# Patient Record
Sex: Male | Born: 1995 | Race: Black or African American | Hispanic: No | Marital: Single | Smoking: Former smoker
Health system: Southern US, Community
[De-identification: ages and names within clinical notes are randomized; demographics above are authoritative.]

---
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
## 2014-03-22 ENCOUNTER — Encounter (HOSPITAL_COMMUNITY): Payer: Self-pay | Admitting: Emergency Medicine

## 2014-03-22 ENCOUNTER — Emergency Department (HOSPITAL_COMMUNITY): Payer: Medicaid Other

## 2014-03-22 ENCOUNTER — Emergency Department (HOSPITAL_COMMUNITY)
Admission: EM | Admit: 2014-03-22 | Discharge: 2014-03-22 | Disposition: A | Discharge status: Home / Self Care | Payer: Medicaid Other | Attending: Emergency Medicine | Admitting: Emergency Medicine

## 2014-03-22 DIAGNOSIS — Y92410 Unspecified street and highway as the place of occurrence of the external cause: Secondary | ICD-10-CM | POA: Insufficient documentation

## 2014-03-22 DIAGNOSIS — IMO0002 Reserved for concepts with insufficient information to code with codable children: Secondary | ICD-10-CM | POA: Insufficient documentation

## 2014-03-22 DIAGNOSIS — S0990XA Unspecified injury of head, initial encounter: Secondary | ICD-10-CM | POA: Diagnosis present

## 2014-03-22 DIAGNOSIS — Z791 Long term (current) use of non-steroidal anti-inflammatories (NSAID): Secondary | ICD-10-CM | POA: Diagnosis not present

## 2014-03-22 DIAGNOSIS — S0001XA Abrasion of scalp, initial encounter: Secondary | ICD-10-CM

## 2014-03-22 DIAGNOSIS — Y9389 Activity, other specified: Secondary | ICD-10-CM | POA: Insufficient documentation
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
## 2014-05-02 ENCOUNTER — Emergency Department (HOSPITAL_BASED_OUTPATIENT_CLINIC_OR_DEPARTMENT_OTHER): Payer: Medicaid Other

## 2014-05-02 ENCOUNTER — Encounter (HOSPITAL_BASED_OUTPATIENT_CLINIC_OR_DEPARTMENT_OTHER): Payer: Self-pay | Admitting: Emergency Medicine

## 2014-05-02 ENCOUNTER — Emergency Department (HOSPITAL_BASED_OUTPATIENT_CLINIC_OR_DEPARTMENT_OTHER)
Admission: EM | Admit: 2014-05-02 | Discharge: 2014-05-02 | Disposition: A | Discharge status: Home / Self Care | Payer: Medicaid Other | Attending: Emergency Medicine | Admitting: Emergency Medicine

## 2014-05-02 DIAGNOSIS — Z79899 Other long term (current) drug therapy: Secondary | ICD-10-CM | POA: Diagnosis not present

## 2014-05-02 DIAGNOSIS — Z720 Tobacco use: Secondary | ICD-10-CM | POA: Insufficient documentation

## 2014-05-02 DIAGNOSIS — R05 Cough: Secondary | ICD-10-CM | POA: Diagnosis not present

## 2014-05-02 DIAGNOSIS — R059 Cough, unspecified: Secondary | ICD-10-CM

## 2014-05-02 MED ORDER — GUAIFENESIN 100 MG/5ML PO SYRP: 100.0000 mg | ORAL_SOLUTION | ORAL | Status: AC | PRN

## 2014-05-02 MED ORDER — ALBUTEROL SULFATE HFA 108 (90 BASE) MCG/ACT IN AERS: 1.0000 | INHALATION_SPRAY | Freq: Four times a day (QID) | RESPIRATORY_TRACT | Status: AC | PRN

## 2014-05-02 NOTE — Discharge Instructions (Signed)
Cough, Adult  A cough is a reflex that helps clear your throat and airways. It can help heal the body or may be a reaction to an irritated airway. A cough may only last 2 or 3 weeks (acute) or may last more than 8 weeks (chronic).  CAUSES Acute cough:  Viral or bacterial infections. Chronic cough:  Infections.  Allergies.  Asthma.  Post-nasal drip.  Smoking.  Heartburn or acid reflux.  Some medicines.  Chronic lung problems (COPD).  Cancer. SYMPTOMS   Cough.  Fever.  Chest pain.  Increased breathing rate.  High-pitched whistling sound when breathing (wheezing).  Colored mucus that you cough up (sputum). TREATMENT   A bacterial cough may be treated with antibiotic medicine.  A viral cough must run its course and will not respond to antibiotics.  Your caregiver may recommend other treatments if you have a chronic cough. HOME CARE INSTRUCTIONS   Only take over-the-counter or prescription medicines for pain, discomfort, or fever as directed by your caregiver. Use cough suppressants only as directed by your caregiver.  Use a cold steam vaporizer or humidifier in your bedroom or home to help loosen secretions.  Sleep in a semi-upright position if your cough is worse at night.  Rest as needed.  Stop smoking if you smoke. SEEK IMMEDIATE MEDICAL CARE IF:   You have pus in your sputum.  Your cough starts to worsen.  You cannot control your cough with suppressants and are losing sleep.  You begin coughing up blood.  You have difficulty breathing.  You develop pain which is getting worse or is uncontrolled with medicine.  You have a fever. MAKE SURE YOU:   Understand these instructions.  Will watch your condition.  Will get help right away if you are not doing well or get worse. Document Released: 12/30/2010 Document Revised: 09/25/2011 Document Reviewed: 12/30/2010 Exit[employer] Patient Information 2015 [employer]. This information is not intended  to replace advice given to you by your health care provider. Make sure you discuss any questions you have with your health care provider.  

## 2014-05-02 NOTE — ED Notes (Signed)
Pt reports cough with sputum production x1 week. Reports cold s/s one week ago.

## 2014-05-02 NOTE — ED Provider Notes (Signed)
CSN: [id number]     Arrival date & time 05/02/14  1819 History  This chart was scribed for Kurt Guzman Obed Samek, MD by Roxy Cedarhandni Bhalodia, ED Scribe. This patient was seen in room MH01/[facility]01 and the patient's care was started at 6:27 PM.   Chief Complaint  Patient presents with  . Cough   Patient is a 18 y.o. male presenting with cough. The history is provided by the patient. No language interpreter was used.  Cough  HPI Comments: Kurt Guzman is a 18 y.o. male with no chronic medical conditions, who presents to the Emergency Department complaining of a moderate productive cough that began 1 week ago. Patient states that he had rhinorrhea and sore throat 1 week ago. Patient has had his cough since cold symptoms began 1 week ago. Patient states that he experiences chest pain with onset of cough. He states that he coughs more during the night than during the day, and reports associated wheezing during the night. Patient states that he used to smoke 1 pack every 2 days but has stopped smoking for the past month.  Patient denies associated fever, headache or hemoptysis.   History reviewed. No pertinent past medical history. History reviewed. No pertinent past surgical history. History reviewed. No pertinent family history. History  Substance Use Topics  . Smoking status: Current Every Day Smoker  . Smokeless tobacco: Not on file  . Alcohol Use: Yes   Review of Systems  Respiratory: Positive for cough.    A complete 10 system review of systems was obtained and all systems are negative except as noted in the HPI and PMH.   Allergies  Review of patient's allergies indicates no known allergies.  Home Medications   Prior to Admission medications   Medication Sig Start Date End Date Taking? Authorizing Provider  albuterol (PROVENTIL HFA;VENTOLIN HFA) 108 (90 BASE) MCG/ACT inhaler Inhale 1-2 puffs into the lungs every 6 (six) hours as needed for wheezing or shortness of breath. 05/02/14   Kurt Guzman  Naavya Postma, MD  guaifenesin (ROBITUSSIN) 100 MG/5ML syrup Take 5-10 mLs (100-200 mg total) by mouth every 4 (four) hours as needed for cough. 05/02/14   Kurt Guzman [name], MD   Triage Vitals: BP 145/105  Pulse 63  Temp(Src) 98.1 F (36.7 C) (Oral)  Resp 21  Ht 6\' 1"  (1.854 m)  Wt 135 lb (61.236 kg)  BMI 17.82 kg/m2  SpO2 100%  Physical Exam  Nursing note and vitals reviewed. Constitutional: He is oriented to person, place, and time. He appears well-developed and well-nourished. No distress.  HENT:  Head: Normocephalic and atraumatic.  Mouth/Throat: Oropharynx is clear and moist. No oropharyngeal exudate.  Eyes: Conjunctivae and EOM are normal. Pupils are equal, round, and reactive to light.  Neck: Normal range of motion. Neck supple.  No meningismus.  Cardiovascular: Normal rate, regular rhythm, normal heart sounds and intact distal pulses.   No murmur heard. Pulmonary/Chest: Effort normal and breath sounds normal. No respiratory distress.  Lungs are clear. No wheezing. No distress.  Abdominal: Soft. There is no tenderness. There is no rebound and no guarding.  Musculoskeletal: Normal range of motion. He exhibits no edema and no tenderness.  Neurological: He is alert and oriented to person, place, and time. No cranial nerve deficit. He exhibits normal muscle tone. Coordination normal.  No ataxia on finger to nose bilaterally. No pronator drift. 5/5 strength throughout. CN 2-12 intact. Negative Romberg. Equal grip strength. Sensation intact. Gait is normal.   Skin: Skin is warm.  Psychiatric: He  has a normal mood and affect. His behavior is normal.   ED Course  Procedures (including critical care time)  DIAGNOSTIC STUDIES: Oxygen Saturation is 100% on RA, normal by my interpretation.    COORDINATION OF CARE: 6:33 PM- Discussed plans to order diagnostic CXR. Pt advised of plan for treatment and pt agrees.  7:37 PM- Discussed normal CXR results. Advised patient to obtain OTC cough  medicine.  Labs Review Labs Reviewed - No data to display  Imaging Review Dg Chest 2 View  05/02/2014   CLINICAL DATA:  18 year old cough for the past week. Prior smoker. Initial encounter.  EXAM: CHEST  2 VIEW  COMPARISON:  None.  FINDINGS: No infiltrate, congestive heart failure or pneumothorax.  Heart size within normal limits.  No plain film evidence of pulmonary malignancy or adenopathy.  No osseous abnormality noted.  IMPRESSION: No acute abnormality.  Please see above.   Electronically Signed   By: Bridgett LarssonSteve  Olson M.D.   On: 05/02/2014 19:21     EKG Interpretation None     MDM   Final diagnoses:  Cough  1 week of cough with clear sputum.  URI symptoms last week.  Chest pain with coughing.  No SOB.  Chest x-ray negative. Patient no distress. No hypoxia no tachycardia.  Supportive care for cough with cough syrup, smoking cessation. No indication for antibiotics.  BP 144/88  Pulse 60  Temp(Src) 98.3 F (36.8 C) (Oral)  Resp 16  Ht 6\' 1"  (1.854 m)  Wt 135 lb (61.236 kg)  BMI 17.82 kg/m2  SpO2 100%   I personally performed the services described in this documentation, which was scribed in my presence. The recorded information has been reviewed and is accurate.  Kurt Guzman Kurt Grudzien, MD 05/02/14 2233

## 2016-08-11 ENCOUNTER — Encounter (HOSPITAL_BASED_OUTPATIENT_CLINIC_OR_DEPARTMENT_OTHER): Payer: Self-pay | Admitting: *Deleted

## 2016-08-11 ENCOUNTER — Emergency Department (HOSPITAL_BASED_OUTPATIENT_CLINIC_OR_DEPARTMENT_OTHER)
Admission: EM | Admit: 2016-08-11 | Discharge: 2016-08-11 | Disposition: A | Discharge status: Home / Self Care | Payer: Medicaid Other | Attending: Emergency Medicine | Admitting: Emergency Medicine

## 2016-08-11 DIAGNOSIS — R0981 Nasal congestion: Secondary | ICD-10-CM | POA: Insufficient documentation

## 2016-08-11 DIAGNOSIS — Z79899 Other long term (current) drug therapy: Secondary | ICD-10-CM | POA: Insufficient documentation

## 2016-08-11 DIAGNOSIS — H9202 Otalgia, left ear: Secondary | ICD-10-CM | POA: Insufficient documentation

## 2016-08-11 DIAGNOSIS — F17200 Nicotine dependence, unspecified, uncomplicated: Secondary | ICD-10-CM | POA: Insufficient documentation

## 2016-08-11 DIAGNOSIS — F1290 Cannabis use, unspecified, uncomplicated: Secondary | ICD-10-CM | POA: Insufficient documentation

## 2016-08-11 DIAGNOSIS — J3489 Other specified disorders of nose and nasal sinuses: Secondary | ICD-10-CM | POA: Insufficient documentation

## 2016-08-11 MED ORDER — FLUTICASONE PROPIONATE 50 MCG/ACT NA SUSP: 2.0000 | Freq: Every day | NASAL | 0 refills | Status: AC

## 2016-08-11 MED ORDER — OXYMETAZOLINE HCL 0.05 % NA SOLN: 2.0000 | Freq: Two times a day (BID) | NASAL | 0 refills | Status: AC

## 2016-08-11 NOTE — Discharge Instructions (Signed)
1. Medications: Afrin, Flonase, Mucinex, usual home medications 2. Treatment: rest, drink plenty of fluids,  3. Follow Up: Please followup with your primary doctor in 7 days for discussion of your diagnoses and further evaluation after today's visit; if you do not have a primary care doctor use the resource guide provided to find one; Please return to the ER for fevers, worsening pain, decreased hearing or other concerns

## 2016-08-11 NOTE — ED Provider Notes (Signed)
[facility] Provider Note   CSN: [id number] Arrival date & time: 08/11/16  1446     History   Chief Complaint Chief Complaint  Patient presents with  . Otalgia    HPI Kurt Guzman is a 21 y.o. male with no major medical hx presents to the Emergency Department complaining of gradual, persistent, progressively worsening Left otalgia onset yesterday after approximately 1 week of URI symptoms including rhinorrhea, nasal congestion, sore throat and cough. He reports that all of the symptoms have resolved and he is feeling much better but developed left otalgia yesterday. Patient reports using antibiotic drops from his mother without relief 1. No other treatments prior to arrival. Nothing makes the symptoms better or worse. He denies ringing or buzzing in his ear. He denies fevers or chills, nausea or vomiting, vision changes, purulent drainage from his nose or epistaxis.   The history is provided by the patient and medical records. No language interpreter was used.    History reviewed. No pertinent past medical history.  There are no active problems to display for this patient.   History reviewed. No pertinent surgical history.     Home Medications    Prior to Admission medications   Medication Sig Start Date End Date Taking? Authorizing Provider  albuterol (PROVENTIL HFA;VENTOLIN HFA) 108 (90 BASE) MCG/ACT inhaler Inhale 1-2 puffs into the lungs every 6 (six) hours as needed for wheezing or shortness of breath. 05/02/14   Glynn Octave, MD  fluticasone (FLONASE) 50 MCG/ACT nasal spray Place 2 sprays into both nostrils daily. 08/11/16   Deshante Cassell, PA-C  guaifenesin (ROBITUSSIN) 100 MG/5ML syrup Take 5-10 mLs (100-200 mg total) by mouth every 4 (four) hours as needed for cough. 05/02/14   Glynn Octave, MD  oxymetazoline (AFRIN NASAL SPRAY) 0.05 % nasal spray Place 2 sprays into both nostrils 2 (two) times daily. 08/11/16 08/14/16  Dahlia Client Zakir Henner, PA-C      Family History No family history on file.  Social History Social History  Substance Use Topics  . Smoking status: Current Every Day Smoker  . Smokeless tobacco: Never Used  . Alcohol use Yes     Allergies   Patient has no known allergies.   Review of Systems Review of Systems  Constitutional: Negative for chills and fever.  HENT: Positive for congestion and ear pain. Negative for sore throat.   Eyes: Negative for visual disturbance.  Respiratory: Negative for cough.   Gastrointestinal: Negative for nausea and vomiting.  Skin: Negative for rash.  Neurological: Negative for headaches.  All other systems reviewed and are negative.    Physical Exam Updated Vital Signs BP 140/90 (BP Location: Right Arm)   Pulse (!) 58   Temp 98.7 F (37.1 C) (Oral)   Resp 16   Ht 6\' 1"  (1.854 m)   Wt 61.2 kg   SpO2 100%   BMI 17.81 kg/m   Physical Exam  Constitutional: He appears well-developed and well-nourished. No distress.  HENT:  Head: Normocephalic and atraumatic.  Right Ear: Tympanic membrane, external ear and ear canal normal.  Left Ear: External ear and ear canal normal. A middle ear effusion ( Clear fluid) is present.  Nose: Mucosal edema and rhinorrhea present. No epistaxis. Right sinus exhibits no maxillary sinus tenderness and no frontal sinus tenderness. Left sinus exhibits no maxillary sinus tenderness and no frontal sinus tenderness.  Mouth/Throat: Uvula is midline and mucous membranes are normal. Mucous membranes are not pale and not cyanotic. No oropharyngeal exudate, posterior oropharyngeal  edema, posterior oropharyngeal erythema or tonsillar abscesses.  Eyes: Conjunctivae are normal. Pupils are equal, round, and reactive to light.  Neck: Normal range of motion and full passive range of motion without pain.  Cardiovascular: Normal rate and intact distal pulses.   Pulmonary/Chest: Effort normal and breath sounds normal. No stridor.  Clear and equal breath  sounds without focal wheezes, rhonchi, rales  Abdominal: Soft. There is no tenderness.  Musculoskeletal: Normal range of motion.  Lymphadenopathy:    He has no cervical adenopathy.  Neurological: He is alert.  Skin: Skin is warm and dry. No rash noted. He is not diaphoretic.  Psychiatric: He has a normal mood and affect.  Nursing note and vitals reviewed.    ED Treatments / Results   Procedures Procedures (including critical care time)  Medications Ordered in ED Medications - No data to display   Initial Impression / Assessment and Plan / ED Course  I have reviewed the triage vital signs and the nursing notes.  Pertinent labs & imaging results that were available during my care of the patient were reviewed by me and considered in my medical decision making (see chart for details).     Patient with recent viral URI. Left ear with clear effusion. No evidence of otitis media. Will give nasal spray. Recommend Mucinex and antihistamine over-the-counter. Patient states understanding and is in agreement with the plan.  Final Clinical Impressions(s) / ED Diagnoses   Final diagnoses:  Otalgia of left ear    New Prescriptions New Prescriptions   FLUTICASONE (FLONASE) 50 MCG/ACT NASAL SPRAY    Place 2 sprays into both nostrils daily.   OXYMETAZOLINE (AFRIN NASAL SPRAY) 0.05 % NASAL SPRAY    Place 2 sprays into both nostrils 2 (two) times daily.     Dahlia ClientHannah Geniyah Eischeid, PA-C 08/11/16 1558    Jacalyn LefevreJulie Haviland, MD 08/14/16 1459

## 2016-08-11 NOTE — ED Triage Notes (Signed)
Left ear pain for a week

## 2023-03-18 ENCOUNTER — Ambulatory Visit
Admission: EM | Admit: 2023-03-18 | Discharge: 2023-03-18 | Disposition: A | Discharge status: Home / Self Care | Payer: Self-pay | Attending: Internal Medicine | Admitting: Internal Medicine

## 2023-03-18 DIAGNOSIS — J111 Influenza due to unidentified influenza virus with other respiratory manifestations: Secondary | ICD-10-CM

## 2023-03-18 DIAGNOSIS — R040 Epistaxis: Secondary | ICD-10-CM

## 2023-03-18 MED ORDER — PSEUDOEPHEDRINE HCL 60 MG PO TABS
60.0000 mg | ORAL_TABLET | Freq: Three times a day (TID) | ORAL | 0 refills | Status: AC | PRN
Start: 2023-03-18 — End: ?

## 2023-03-18 MED ORDER — PROMETHAZINE-DM 6.25-15 MG/5ML PO SYRP: 5.0000 mL | ORAL_SOLUTION | Freq: Three times a day (TID) | ORAL | 0 refills | Status: AC | PRN

## 2023-03-18 MED ORDER — CETIRIZINE HCL 10 MG PO TABS: 10.0000 mg | ORAL_TABLET | Freq: Every day | ORAL | 0 refills | Status: AC

## 2023-03-18 MED ORDER — OXYMETAZOLINE HCL 0.05 % NA SOLN
1.0000 | Freq: Two times a day (BID) | NASAL | Status: DC
  Administered 2023-03-18: 1 via NASAL

## 2023-03-18 NOTE — ED Notes (Signed)
Pt is not active bleeding at this moment, gauze given to the pt, seating lean forward and pinch his nose.

## 2023-03-18 NOTE — ED Triage Notes (Signed)
Pt reports he had nosebleed last night when he blew his nose, started bleeding again today 20 min ago.

## 2023-03-18 NOTE — ED Provider Notes (Signed)
[facility]  Note:  This document was prepared using Conservation officer, historic buildings and may include unintentional dictation errors.  MRN: [id number] DOB: 1996-05-29  Subjective:   Kurt Guzman is a 27 y.o. male presenting for 1 day history of persistent intermittent bleeding from his nose.  Symptoms started after he blew his nose really hard yesterday.  Was more controlled this morning until an hour prior to arrival in the clinic.  The symptoms have waxed and waned but for the past 20 minutes has persisted.  No current facility-administered medications for this encounter.  Current Outpatient Medications:    albuterol (PROVENTIL HFA;VENTOLIN HFA) 108 (90 BASE) MCG/ACT inhaler, Inhale 1-2 puffs into the lungs every 6 (six) hours as needed for wheezing or shortness of breath., Disp: 1 Inhaler, Rfl: 0   fluticasone (FLONASE) 50 MCG/ACT nasal spray, Place 2 sprays into both nostrils daily., Disp: 9.9 g, Rfl: 0   guaifenesin (ROBITUSSIN) 100 MG/5ML syrup, Take 5-10 mLs (100-200 mg total) by mouth every 4 (four) hours as needed for cough., Disp: 60 mL, Rfl: 0   No Known Allergies  History reviewed. No pertinent past medical history.   History reviewed. No pertinent surgical history.  History reviewed. No pertinent family history.  Social History   Tobacco Use   Smoking status: Former    Types: Cigarettes   Smokeless tobacco: Never  Vaping Use   Vaping status: Never Used  Substance Use Topics   Alcohol use: Yes    Comment: Socially   Drug use: Yes    Frequency: 3.0 times per week    Types: Marijuana    ROS   Objective:   Vitals: BP (!) 139/95 (BP Location: Left Arm)   Pulse 95   Temp 98.4 F (36.9 C) (Temporal)   Resp 18   SpO2 96%   Physical Exam Constitutional:      General: He is not in acute distress.    Appearance: Normal appearance. He is well-developed and normal weight. He is not ill-appearing, toxic-appearing or diaphoretic.   HENT:     Head: Normocephalic and atraumatic.     Right Ear: External ear normal.     Left Ear: External ear normal.     Nose: Congestion present. No rhinorrhea.     Right Nostril: Epistaxis present. No foreign body, septal hematoma or occlusion.     Left Nostril: Epistaxis present. No foreign body, septal hematoma or occlusion.     Mouth/Throat:     Pharynx: Oropharynx is clear.  Eyes:     General: No scleral icterus.       Right eye: No discharge.        Left eye: No discharge.     Extraocular Movements: Extraocular movements intact.  Cardiovascular:     Rate and Rhythm: Normal rate.  Pulmonary:     Effort: Pulmonary effort is normal.  Musculoskeletal:     Cervical back: Normal range of motion.  Neurological:     Mental Status: He is alert and oriented to person, place, and time.  Psychiatric:        Mood and Affect: Mood normal.        Behavior: Behavior normal.        Thought Content: Thought content normal.        Judgment: Judgment normal.      Assessment and Plan :   PDMP not reviewed this encounter.  1. Epistaxis   2. Influenza    Unfortunately, our clinic  does not carry any Rhino Rockets, do not have the ability to cauterize here.  Attempted to use gauze packing, nasal pressure and oxymetazoline for his epistaxis.  Recommended controlling his sinus symptoms with Zyrtec, pseudoephedrine at home.  His wife expressed significant concern with these measures and therefore recommended he present to the emergency room for further intervention.    Wallis Bamberg, PA-C 03/18/23 1259

## 2023-03-18 NOTE — Discharge Instructions (Addendum)
Use the oxymetazoline again today and twice more tomorrow.  If you continue to have nasal bleeding that you cannot control with the measures we have tried then please go to the emergency room to see if they can apply a Rhino Rocket.  I tried to pack your nose with gauze and lubricant and recommend that you attempt these measures together with firm pressure directly over the nose to help with the nasal bleeding.  Please go to the emergency room as they would have the ability to pack your nose using a Rhino Rocket.  Unfortunately our clinic does not carry this or cautery.  For now avoid blowing your nose excessively.  I am prescribing you Zyrtec and pseudoephedrine to help you with your nasal congestion, sneezing and drainage.  Use the cough syrup to help with avoiding this as a source of your symptoms/nasal bleeding.

## 2024-01-27 ENCOUNTER — Ambulatory Visit
Admission: EM | Admit: 2024-01-27 | Discharge: 2024-01-27 | Disposition: A | Discharge status: Home / Self Care | Payer: Self-pay | Attending: Physician Assistant | Admitting: Physician Assistant

## 2024-01-27 ENCOUNTER — Other Ambulatory Visit: Payer: Self-pay

## 2024-01-27 DIAGNOSIS — J029 Acute pharyngitis, unspecified: Secondary | ICD-10-CM | POA: Insufficient documentation

## 2024-01-27 DIAGNOSIS — J069 Acute upper respiratory infection, unspecified: Secondary | ICD-10-CM | POA: Insufficient documentation

## 2024-01-27 LAB — POC COVID19/FLU A&B COMBO
Covid Antigen, POC: NEGATIVE
Influenza A Antigen, POC: NEGATIVE
Influenza B Antigen, POC: NEGATIVE

## 2024-01-27 LAB — POCT RAPID STREP A (OFFICE): Rapid Strep A Screen: NEGATIVE

## 2024-01-27 NOTE — Discharge Instructions (Signed)

## 2024-01-27 NOTE — ED Triage Notes (Signed)
 Pt presents with complaints of sore throat and headache x 6 days. States his symptoms began after returning from vacation at the beach. Currently rates overall pain an 8/10. Denies fevers at home. OTC Advil  + Aleve taken, also leftover Amoxicillin tablet.

## 2024-01-27 NOTE — ED Provider Notes (Signed)
 [facility]    CSN: [id number] Arrival date & time: 01/27/24  1353      History   Chief Complaint Chief Complaint  Patient presents with   Sore Throat   Headache    HPI Kurt Guzman is a 28 y.o. male.   HPI   Pt presents today for concerns of headache, sore throat, new ear pain He also reports severe dry cough last night that is not productive  He reports he has had symptoms for about 3-4 days and recently returned from the beach He has been taking Mucinex ,Naproxen, Amoxicillin    History reviewed. No pertinent past medical history.  There are no active problems to display for this patient.   History reviewed. No pertinent surgical history.     Home Medications    Prior to Admission medications   Medication Sig Start Date End Date Taking? Authorizing Provider  albuterol  (PROVENTIL  HFA;VENTOLIN  HFA) 108 (90 BASE) MCG/ACT inhaler Inhale 1-2 puffs into the lungs every 6 (six) hours as needed for wheezing or shortness of breath. 05/02/14   Rancour, Garnette, MD  cetirizine  (ZYRTEC  ALLERGY) 10 MG tablet Take 1 tablet (10 mg total) by mouth daily. 03/18/23   Christopher Savannah, PA-C  fluticasone  (FLONASE ) 50 MCG/ACT nasal spray Place 2 sprays into both nostrils daily. 08/11/16   Muthersbaugh, Chiquita, PA-C  guaifenesin  (ROBITUSSIN) 100 MG/5ML syrup Take 5-10 mLs (100-200 mg total) by mouth every 4 (four) hours as needed for cough. 05/02/14   Rancour, Garnette, MD  promethazine -dextromethorphan  (PROMETHAZINE -DM) 6.25-15 MG/5ML syrup Take 5 mLs by mouth 3 (three) times daily as needed for cough. 03/18/23   Christopher Savannah, PA-C  pseudoephedrine  (SUDAFED) 60 MG tablet Take 1 tablet (60 mg total) by mouth every 8 (eight) hours as needed for congestion. 03/18/23   Christopher Savannah, PA-C    Family History History reviewed. No pertinent family history.  Social History Social History   Tobacco Use   Smoking status: Former    Types: Cigarettes   Smokeless tobacco: Never  Vaping Use    Vaping status: Never Used  Substance Use Topics   Alcohol use: Yes    Comment: Socially   Drug use: Yes    Frequency: 3.0 times per week    Types: Marijuana     Allergies   Patient has no known allergies.   Review of Systems Review of Systems  Constitutional:  Negative for chills and fever.  HENT:  Positive for congestion, ear pain and sore throat. Negative for postnasal drip and rhinorrhea.   Respiratory:  Negative for cough, shortness of breath and wheezing.   Gastrointestinal:  Negative for diarrhea, nausea and vomiting.  Musculoskeletal:  Negative for myalgias.  Skin:  Negative for rash.  Neurological:  Positive for headaches. Negative for dizziness and light-headedness.     Physical Exam Triage Vital Signs ED Triage Vitals  Encounter Vitals Group     BP 01/27/24 1417 (!) 144/93     Girls Systolic BP Percentile --      Girls Diastolic BP Percentile --      Boys Systolic BP Percentile --      Boys Diastolic BP Percentile --      Pulse Rate 01/27/24 1417 62     Resp 01/27/24 1417 18     Temp 01/27/24 1417 98.2 F (36.8 C)     Temp Source 01/27/24 1417 Oral     SpO2 01/27/24 1417 98 %     Weight 01/27/24 1417 185 lb (83.9 kg)  Height 01/27/24 1417 6' 1 (1.854 m)     Head Circumference --      Peak Flow --      Pain Score 01/27/24 1430 8     Pain Loc --      Pain Education --      Exclude from Growth Chart --    No data found.  Updated Vital Signs BP (!) 144/93 (BP Location: Right Arm)   Pulse 62   Temp 98.2 F (36.8 C) (Oral)   Resp 18   Ht 6' 1 (1.854 m)   Wt 185 lb (83.9 kg)   SpO2 98%   BMI 24.41 kg/m   Visual Acuity Right Eye Distance:   Left Eye Distance:   Bilateral Distance:    Right Eye Near:   Left Eye Near:    Bilateral Near:     Physical Exam Vitals reviewed.  Constitutional:      General: He is awake.     Appearance: Normal appearance. He is well-developed and well-groomed.  HENT:     Head: Normocephalic and  atraumatic.     Right Ear: Hearing, tympanic membrane and ear canal normal.     Left Ear: Hearing, tympanic membrane and ear canal normal.     Mouth/Throat:     Lips: Pink.     Mouth: Mucous membranes are moist.     Pharynx: Uvula midline. Pharyngeal swelling and posterior oropharyngeal erythema present. No oropharyngeal exudate, uvula swelling or postnasal drip.     Tonsils: No tonsillar exudate or tonsillar abscesses. 1+ on the right. 2+ on the left.  Eyes:     General: Lids are normal. Gaze aligned appropriately.     Extraocular Movements: Extraocular movements intact.  Cardiovascular:     Rate and Rhythm: Normal rate and regular rhythm.     Heart sounds: Normal heart sounds.  Pulmonary:     Effort: Pulmonary effort is normal.     Breath sounds: Normal breath sounds. No decreased air movement. No decreased breath sounds, wheezing, rhonchi or rales.  Musculoskeletal:     Cervical back: Normal range of motion and neck supple.  Lymphadenopathy:     Head:     Right side of head: No submental, submandibular or preauricular adenopathy.     Left side of head: No submental, submandibular or preauricular adenopathy.     Cervical:     Right cervical: No superficial cervical adenopathy.    Left cervical: No superficial cervical adenopathy.     Upper Body:     Right upper body: No supraclavicular adenopathy.     Left upper body: No supraclavicular adenopathy.  Skin:    General: Skin is warm and dry.  Neurological:     General: No focal deficit present.     Mental Status: He is alert and oriented to person, place, and time.  Psychiatric:        Mood and Affect: Mood normal.        Behavior: Behavior normal. Behavior is cooperative.        Thought Content: Thought content normal.        Judgment: Judgment normal.      UC Treatments / Results  Labs (all labs ordered are listed, but only abnormal results are displayed) Labs Reviewed  CULTURE, GROUP A STREP ([facility])  POC COVID19/FLU  A&B COMBO  POCT RAPID STREP A (OFFICE)    EKG   Radiology No results found.  Procedures Procedures (including critical care time)  Medications Ordered  in UC Medications - No data to display  Initial Impression / Assessment and Plan / UC Course  I have reviewed the triage vital signs and the nursing notes.  Pertinent labs & imaging results that were available during my care of the patient were reviewed by me and considered in my medical decision making (see chart for details).      Final Clinical Impressions(s) / UC Diagnoses   Final diagnoses:  Sore throat  Acute upper respiratory infection   Visit with patient indicates symptoms comprised of sore throat, nasal congestion, ear pain and headaches for the past 3-4 days congruent with acute URI that is likely viral in nature  Patient has tested negative for COVID, Flu, strep Will send strep culture for definitive rule out due to pharyngeal erythema   Due to nature and duration of symptoms recommended treatment regimen is symptomatic relief and follow up if needed Recommend he stop taking abx as he does not have adequate supply to manage an infection and this can lead to side effects or abx resistance.  Discussed with patient the various viral and bacterial etiologies of current illness and appropriate course of treatment Discussed OTC medication options for multisymptom relief such as Dayquil/Nyquil, Theraflu, AlkaSeltzer, etc. Discussed return precautions if symptoms are not improving or worsen over next 5-7 days.      Discharge Instructions      Based on your described symptoms and the duration of symptoms it is likely that you have a viral upper respiratory infection (often called a cold)  Symptoms can last for 3-10 days with lingering cough and intermittent symptoms potentially  lasting several  weeks after that.  The goal of treatment at this time is to reduce your symptoms and discomfort   You can use the  following medications and measures to help yourself feel better until your body fights this off: DayQuil/NyQuil, TheraFlu, Alka-Seltzer  (these medications typically have the same active ingredients in them so you can choose whichever one you prefer and take consistently during the day and night according to the manufactures instructions.) Flonase  A daily antihistamine such as Zyrtec , Claritin, Allegra per your preference.  Please choose 1 and take consistently. Increased fluids.  It is recommended that you take in at least 64 ounces of water per day when you are not sick so it is important to increase this when you are sick and your body may be running fever. Rest Cough drops Chloraseptic throat spray to help with sore throat Nasal saline spray or nasal flushes to help with congestion and runny nose  If your symptoms seem like they are getting worse over the next 5 to 7 days or not improving you can always follow-up here in urgent care or go to your primary care provider for further management. Go to the ER if you begin to have more serious symptoms such as shortness of breath, trouble breathing, loss of consciousness, swelling around the eyes, high fever, severe lasting headaches, vision changes or neck pain/stiffness.       ED Prescriptions   None    PDMP not reviewed this encounter.   Marylene Rocky BRAVO, PA-C 01/27/24 1646

## 2024-01-29 LAB — CULTURE, GROUP A STREP (THRC)

## 2024-02-01 ENCOUNTER — Ambulatory Visit (HOSPITAL_COMMUNITY): Payer: Self-pay
